# Patient Record
Sex: Male | Born: 1965 | Race: White | Hispanic: No | Marital: Married | Smoking: Never smoker
Health system: Southern US, Community
[De-identification: ages and names within clinical notes are randomized; demographics above are authoritative.]

## PROBLEM LIST (undated history)

## (undated) DIAGNOSIS — L280 Lichen simplex chronicus: Secondary | ICD-10-CM

## (undated) DIAGNOSIS — T7840XA Allergy, unspecified, initial encounter: Secondary | ICD-10-CM

## (undated) HISTORY — DX: Allergy, unspecified, initial encounter: T78.40XA

## (undated) HISTORY — PX: TONSILLECTOMY: SUR1361

## (undated) HISTORY — DX: Lichen simplex chronicus: L28.0

---

## 2011-05-23 ENCOUNTER — Encounter: Payer: Self-pay | Admitting: *Deleted

## 2011-05-23 ENCOUNTER — Emergency Department (INDEPENDENT_AMBULATORY_CARE_PROVIDER_SITE_OTHER): Payer: Worker's Compensation

## 2011-05-23 ENCOUNTER — Emergency Department (HOSPITAL_BASED_OUTPATIENT_CLINIC_OR_DEPARTMENT_OTHER)
Admission: EM | Admit: 2011-05-23 | Discharge: 2011-05-24 | Disposition: A | Discharge status: Home / Self Care | Payer: Worker's Compensation | Attending: Emergency Medicine | Admitting: Emergency Medicine

## 2011-05-23 DIAGNOSIS — S139XXA Sprain of joints and ligaments of unspecified parts of neck, initial encounter: Secondary | ICD-10-CM | POA: Insufficient documentation

## 2011-05-23 DIAGNOSIS — S161XXA Strain of muscle, fascia and tendon at neck level, initial encounter: Secondary | ICD-10-CM

## 2011-05-23 DIAGNOSIS — Y92410 Unspecified street and highway as the place of occurrence of the external cause: Secondary | ICD-10-CM | POA: Insufficient documentation
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
## 2011-05-23 NOTE — ED Notes (Signed)
Dr. MIller at bedside

## 2011-05-24 DIAGNOSIS — M542 Cervicalgia: Secondary | ICD-10-CM

## 2011-05-24 MED ORDER — METHOCARBAMOL 500 MG PO TABS: 500.0000 mg | ORAL_TABLET | Freq: Two times a day (BID) | ORAL | Status: AC | PRN

## 2011-05-24 MED ORDER — NAPROXEN 500 MG PO TABS: 500.0000 mg | ORAL_TABLET | Freq: Two times a day (BID) | ORAL | Status: AC

## 2012-09-07 ENCOUNTER — Encounter: Payer: Self-pay | Admitting: Family Medicine

## 2012-09-14 ENCOUNTER — Ambulatory Visit (INDEPENDENT_AMBULATORY_CARE_PROVIDER_SITE_OTHER): Payer: 59 | Admitting: Family Medicine

## 2012-09-14 VITALS — BP 100/62 | HR 47 | Temp 97.7°F | Resp 16 | Ht 71.0 in | Wt 176.0 lb

## 2012-09-14 DIAGNOSIS — Z79899 Other long term (current) drug therapy: Secondary | ICD-10-CM

## 2012-09-14 DIAGNOSIS — B351 Tinea unguium: Secondary | ICD-10-CM

## 2012-09-14 DIAGNOSIS — M792 Neuralgia and neuritis, unspecified: Secondary | ICD-10-CM

## 2012-09-14 DIAGNOSIS — Z23 Encounter for immunization: Secondary | ICD-10-CM

## 2012-09-14 DIAGNOSIS — Z0000 Encounter for general adult medical examination without abnormal findings: Secondary | ICD-10-CM

## 2012-09-14 DIAGNOSIS — S76319A Strain of muscle, fascia and tendon of the posterior muscle group at thigh level, unspecified thigh, initial encounter: Secondary | ICD-10-CM

## 2012-09-14 DIAGNOSIS — L989 Disorder of the skin and subcutaneous tissue, unspecified: Secondary | ICD-10-CM

## 2012-09-14 LAB — TSH: TSH: 2.377 u[IU]/mL (ref 0.350–4.500)

## 2012-09-14 LAB — CBC WITH DIFFERENTIAL/PLATELET
Basophils Relative: 1 % (ref 0–1)
Eosinophils Absolute: 0.3 10*3/uL (ref 0.0–0.7)
Eosinophils Relative: 9 % — ABNORMAL HIGH (ref 0–5)
Hemoglobin: 14.3 g/dL (ref 13.0–17.0)
Lymphs Abs: 1.1 10*3/uL (ref 0.7–4.0)
MCH: 30 pg (ref 26.0–34.0)
MCHC: 34.9 g/dL (ref 30.0–36.0)
MCV: 86 fL (ref 78.0–100.0)
Monocytes Relative: 12 % (ref 3–12)
Neutrophils Relative %: 45 % (ref 43–77)
RBC: 4.77 MIL/uL (ref 4.22–5.81)

## 2012-09-14 LAB — COMPREHENSIVE METABOLIC PANEL
Albumin: 4.2 g/dL (ref 3.5–5.2)
Alkaline Phosphatase: 61 U/L (ref 39–117)
BUN: 26 mg/dL — ABNORMAL HIGH (ref 6–23)
CO2: 23 mEq/L (ref 19–32)
Calcium: 9.4 mg/dL (ref 8.4–10.5)
Creat: 1.01 mg/dL (ref 0.50–1.35)
Glucose, Bld: 80 mg/dL (ref 70–99)
Sodium: 142 mEq/L (ref 135–145)
Total Bilirubin: 0.6 mg/dL (ref 0.3–1.2)
Total Protein: 6.6 g/dL (ref 6.0–8.3)

## 2012-09-14 LAB — LIPID PANEL
Cholesterol: 196 mg/dL (ref 0–200)
HDL: 72 mg/dL (ref >39)
LDL Cholesterol: 111 mg/dL — ABNORMAL HIGH (ref 0–99)
Triglycerides: 67 mg/dL (ref <150)
VLDL: 13 mg/dL (ref 0–40)

## 2012-09-14 MED ORDER — TERBINAFINE HCL 250 MG PO TABS: 250.0000 mg | ORAL_TABLET | Freq: Every day | ORAL | Status: DC

## 2012-09-14 NOTE — Progress Notes (Signed)
Subjective:    Patient ID: Gregory Ayala, male    DOB: Oct 16, 1965, 47 y.o.   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 He has not had one in many years and does have a few concerns. Has no idea when last Td immunization was. Has never had a colonoscopy or PSA test.  He does have a lesion on the side of his nose for several mos he would like looked at. Had a similar one on the other side but it went away, will scratch at it and it will bleed. Went running the other day and think he might have pulled something. His posterior Left leg is hurting a lot. When sitting in his vehicle on duty - with the large utility belt around his waist, after about 30 min to an hr his legs will fall asleep. He will get numbness and tingling from his buttocks radiating down and he has to stand up and move around.   Past Medical History  Diagnosis Date  . Allergy    Past Surgical History  Procedure Laterality Date  . Tonsillectomy     No current outpatient prescriptions on file prior to visit.   No current facility-administered medications on file prior to visit.   No Known Allergies Family History  Problem Relation Age of Onset  . Hypertension Father    History   Social History  . Marital Status: Married    Spouse Name: N/A    Number of Children: N/A  . Years of Education: N/A   Social History Main Topics  . Smoking status: Never Smoker   . Smokeless tobacco: Never Used  . Alcohol Use: 3.6 oz/week    6 Cans of beer per week  . Drug Use: No  . Sexually Active: None   Other Topics Concern  . None   Social History Narrative  . Work in Patent examiner  Does aerobic exercise 3x/wks for 30 min   Review of Systems  Constitutional: Negative.   HENT: Negative.   Eyes: Negative.   Respiratory: Negative.   Cardiovascular: Negative.   Gastrointestinal: Negative.   Endocrine: Negative.   Genitourinary: Negative.   Musculoskeletal: Negative.    Skin: Positive for wound.  Allergic/Immunologic: Negative.   Neurological: Negative.   Hematological: Negative.   Psychiatric/Behavioral: Negative.       BP 100/62  Pulse 47  Temp(Src) 97.7 F (36.5 C) (Oral)  Resp 16  Ht 5\' 11"  (1.803 m)  Wt 176 lb (79.833 kg)  BMI 24.56 kg/m2  SpO2 97% Objective:   Physical Exam  Constitutional: He is oriented to person, place, and time. He appears well-developed and well-nourished. No distress.  HENT:  Head: Normocephalic and atraumatic.  Right Ear: Tympanic membrane, external ear and ear canal normal.  Left Ear: Tympanic membrane, external ear and ear canal normal.  Nose: Nose normal.  Mouth/Throat: Uvula is midline, oropharynx is clear and moist and mucous membranes are normal. No oropharyngeal exudate.  Eyes: Conjunctivae are normal. Right eye exhibits no discharge. Left eye exhibits no discharge. No scleral icterus.  Neck: Normal range of motion. Neck supple. No thyromegaly present.  Cardiovascular: Normal rate, regular rhythm, normal heart sounds and intact distal pulses.   Pulmonary/Chest: Effort normal and breath sounds normal. No respiratory distress.  Abdominal: Soft. Bowel sounds are normal. He exhibits no distension and no mass. There is no tenderness. There is no rebound and no guarding.  Musculoskeletal:  He exhibits no edema.       Right hip: Normal.       Left hip: Normal.       Lumbar back: Normal.       Right upper leg: Normal.       Left upper leg: Normal.  Lymphadenopathy:    He has no cervical adenopathy.  Neurological: He is alert and oriented to person, place, and time. He has normal reflexes. No cranial nerve deficit. He exhibits normal muscle tone.  Reflex Scores:      Patellar reflexes are 2+ on the right side and 2+ on the left side.      Achilles reflexes are 2+ on the right side and 2+ on the left side. Skin: Skin is warm and dry. Lesion noted. No rash noted. He is not diaphoretic. No erythema.  Toenails  thick, yellow, cracking Small pearly pink lesion on side of left nose with central ulceration and few telangiectasias.  Psychiatric: He has a normal mood and affect. His behavior is normal.      Assessment & Plan:  Neuralgia - Plan: TSH, Vitamin D 25 hydroxy, Lipid panel, PSA, CBC with Differential, Comprehensive metabolic panel - likely just from increased pressure onto sciatic nerve from heavy belt - try rocking forward onto ischial tuberosities.  Hamstring strain - Plan: TSH, Vitamin D 25 hydroxy, Lipid panel, PSA, CBC with Differential, Comprehensive metabolic panel. RICE  Lesion of ala of nose - Plan: Ambulatory referral to Dermatology, TSH, Vitamin D 25 hydroxy, Lipid panel, PSA, CBC with Differential, Comprehensive metabolic panel - refer to derm as concern for basal cell. Reluctant to do biopsy in office since on face.  Onychomycosis - Plan: terbinafine (LAMISIL) 250 MG tablet, TSH, Vitamin D 25 hydroxy, Lipid panel, PSA, CBC with Differential, Comprehensive metabolic panel - starting lamisil so RTC in 6-8 wks for LFT recheck - future orders entered so can be lab visit only  Routine general medical examination at a health care facility - Plan: TSH, Vitamin D 25 hydroxy, Lipid panel, PSA, CBC with Differential, Comprehensive metabolic panel  Need for prophylactic vaccination with combined diphtheria-tetanus-pertussis (DTP) vaccine - Plan: Tdap vaccine greater than or equal to 7yo IM  Encounter for long-term (current) use of other medications - Plan: Hepatic Function Panel  Meds ordered this encounter  Medications  . terbinafine (LAMISIL) 250 MG tablet    Sig: Take 1 tablet (250 mg total) by mouth daily.    Dispense:  90 tablet    Refill:  1

## 2012-09-16 ENCOUNTER — Telehealth: Payer: Self-pay

## 2012-09-16 NOTE — Telephone Encounter (Signed)
Pt is calling back about lab results, he missed the first call

## 2012-09-17 NOTE — Telephone Encounter (Signed)
Labs perfect. Drink more water. Per Dr Clelia Croft. Left message for him, he missed this call too.

## 2012-09-17 NOTE — Telephone Encounter (Signed)
Pt.notified

## 2012-10-17 DIAGNOSIS — L280 Lichen simplex chronicus: Secondary | ICD-10-CM

## 2012-10-17 HISTORY — DX: Lichen simplex chronicus: L28.0

## 2012-10-26 ENCOUNTER — Other Ambulatory Visit (INDEPENDENT_AMBULATORY_CARE_PROVIDER_SITE_OTHER): Payer: 59 | Admitting: Family Medicine

## 2012-10-26 DIAGNOSIS — Z79899 Other long term (current) drug therapy: Secondary | ICD-10-CM

## 2012-10-26 LAB — HEPATIC FUNCTION PANEL
Albumin: 4.4 g/dL (ref 3.5–5.2)
Total Protein: 6.7 g/dL (ref 6.0–8.3)

## 2012-10-26 NOTE — Progress Notes (Signed)
Patient here for labs only. 

## 2012-11-06 ENCOUNTER — Encounter: Payer: Self-pay | Admitting: Family Medicine

## 2013-04-23 ENCOUNTER — Other Ambulatory Visit: Payer: Self-pay | Admitting: Family Medicine

## 2013-04-26 ENCOUNTER — Other Ambulatory Visit: Payer: Self-pay | Admitting: Family Medicine

## 2013-04-26 NOTE — Telephone Encounter (Signed)
Dr Clelia Croft, do you want to continue pt on this, or does he need to RTC for re-eval or another LFT lab?

## 2013-04-26 NOTE — Telephone Encounter (Signed)
My concern is that if it hasn't worked after being on it for 6 months - that it might not work at all.  If he is still having toenail problems, rec RTC so we can clip his nail and send it for fungal culture to see if this is actually what is going on or if it could be something else (another dermatologic problem) mimicking nail fungus.  If it is almost better and almost gone - ok to refill once more only for one 90d supply.

## 2013-05-01 NOTE — Telephone Encounter (Signed)
LMOM on cell # to CB. 

## 2013-05-01 NOTE — Telephone Encounter (Signed)
Pt CB and reported that the fungus has almost completely resolved, just a small place left. Pt agreed to RTC if persists or worsens again.

## 2013-08-05 IMAGING — CR DG CHEST 1V
1 series · 1 of 1 positions shown · non-contrast
Comparison: None.

CLINICAL DATA: No history given.

CHEST - 1 VIEW

[PA]
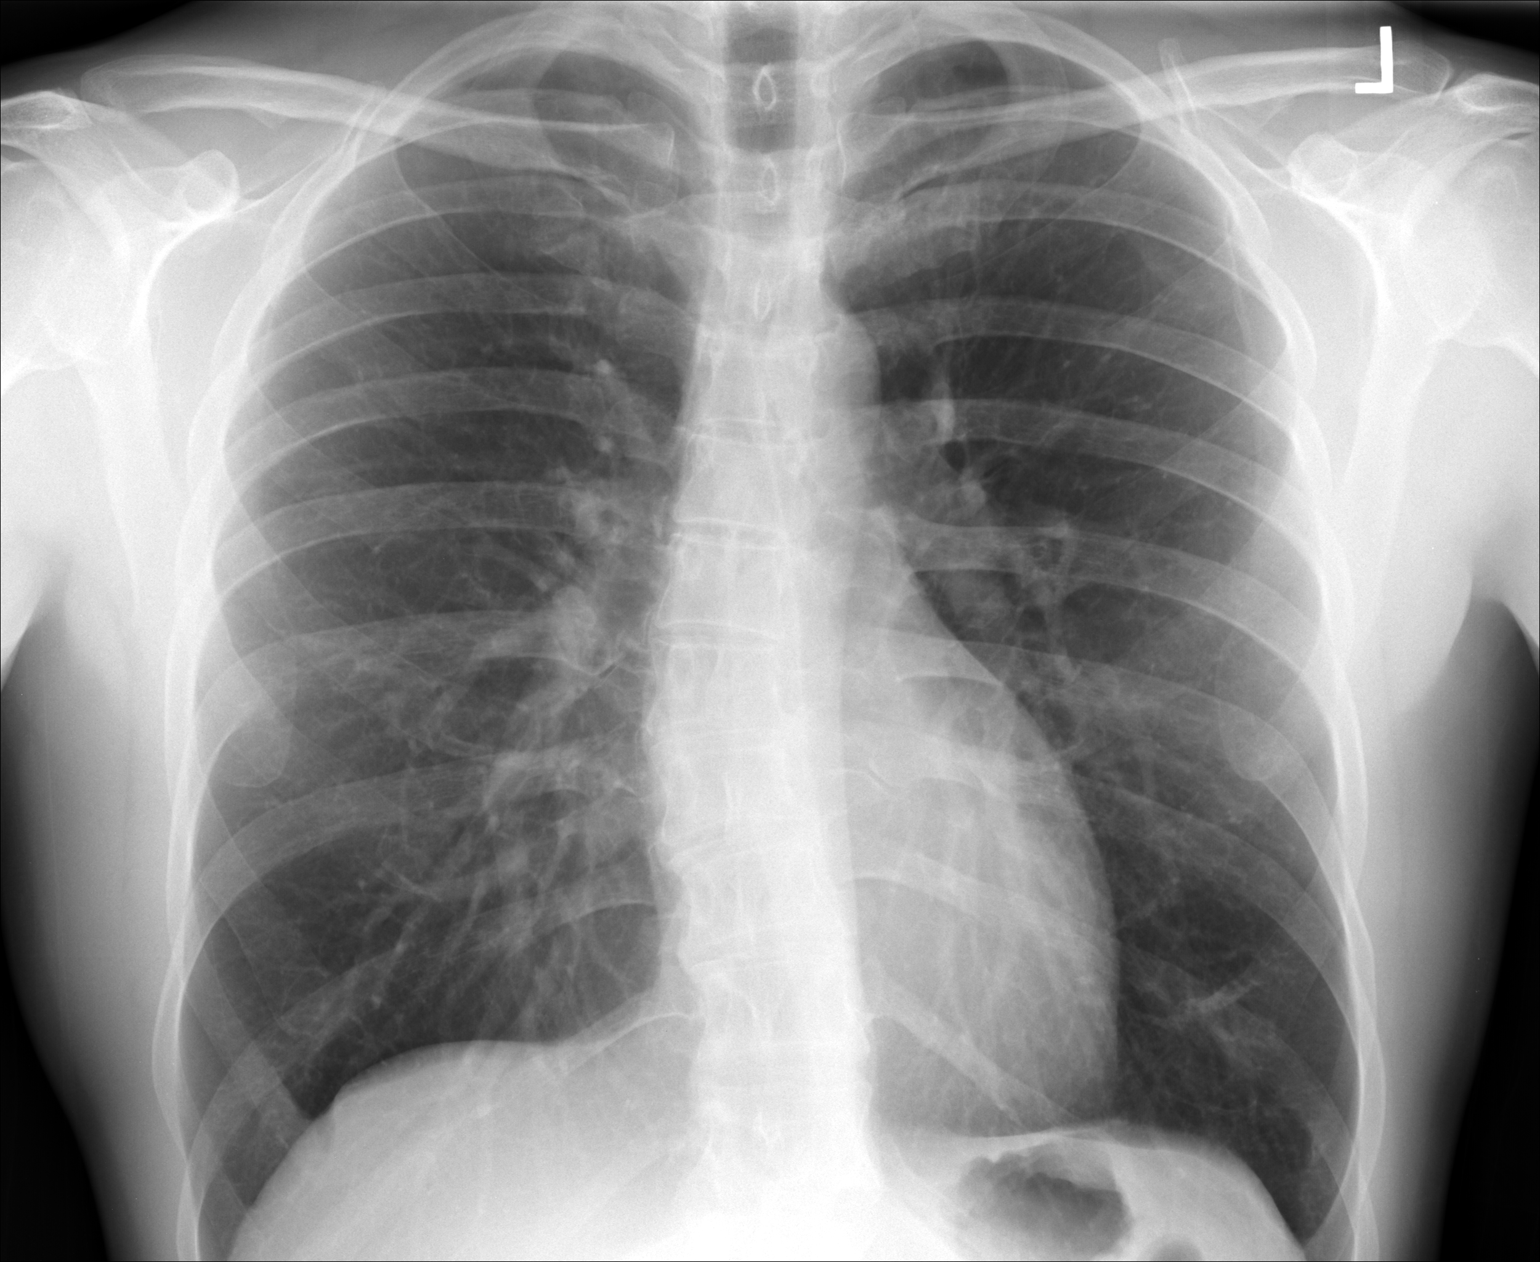

[1 of 1 positions shown; findings below may reference images not displayed]

FINDINGS: The cardiac silhouette is normal size and shape.
Diaphragm is low-lying which may reflect body habitus or
hyperinflation.  No pulmonary infiltrates or nodules.  No
mediastinal or hilar enlargement. No pleural abnormality is
evident.  Slight scoliosis convexity to the right.
IMPRESSION: Low-lying diaphragm may reflect body habitus versus hyperinflation.
No pulmonary infiltrates.  Scoliosis.

## 2014-06-18 ENCOUNTER — Ambulatory Visit (INDEPENDENT_AMBULATORY_CARE_PROVIDER_SITE_OTHER): Payer: 59 | Admitting: Family Medicine

## 2014-06-18 VITALS — BP 117/73 | HR 60 | Temp 98.0°F | Resp 16 | Ht 71.0 in | Wt 179.8 lb

## 2014-06-18 DIAGNOSIS — R05 Cough: Secondary | ICD-10-CM

## 2014-06-18 DIAGNOSIS — H9202 Otalgia, left ear: Secondary | ICD-10-CM

## 2014-06-18 DIAGNOSIS — J029 Acute pharyngitis, unspecified: Secondary | ICD-10-CM

## 2014-06-18 DIAGNOSIS — R059 Cough, unspecified: Secondary | ICD-10-CM

## 2014-06-18 LAB — POCT RAPID STREP A (OFFICE): RAPID STREP A SCREEN: NEGATIVE

## 2014-06-18 MED ORDER — AMOXICILLIN 875 MG PO TABS: 875.0000 mg | ORAL_TABLET | Freq: Two times a day (BID) | ORAL | Status: DC

## 2014-06-18 MED ORDER — HYDROCODONE-HOMATROPINE 5-1.5 MG/5ML PO SYRP: 5.0000 mL | ORAL_SOLUTION | ORAL | Status: DC | PRN

## 2014-06-18 NOTE — Patient Instructions (Signed)
Drink plenty of fluids and get enough rest  Return if worse  Amoxicillin twice daily  Cough syrup 1 teaspoon every 4 hours as needed.  Can cause sedation.

## 2014-06-18 NOTE — Progress Notes (Signed)
Subjective: Sore throat, then cough.  Started 5 days ago.  Mild productivity .  Trouble resting.Otalgia.  Objective: TM's nl, throat mild erythema. TM's nl.  Neck supple, no major nodes.  Chest CTA  Assessment Pharyngitis, cough  Plan RST  Results for orders placed or performed in visit on 06/18/14  POCT rapid strep A  Result Value Ref Range   Rapid Strep A Screen Negative Negative   RX symptomatically and add amoxicillin bid RTC if worse

## 2015-07-31 LAB — IFOBT (OCCULT BLOOD): IMMUNOLOGICAL FECAL OCCULT BLOOD TEST: NEGATIVE

## 2015-09-09 ENCOUNTER — Encounter: Payer: Self-pay | Admitting: Family Medicine

## 2015-11-16 ENCOUNTER — Ambulatory Visit (INDEPENDENT_AMBULATORY_CARE_PROVIDER_SITE_OTHER): Payer: 59 | Admitting: Family Medicine

## 2015-11-16 VITALS — BP 108/70 | HR 50 | Temp 97.9°F | Resp 15 | Ht 71.0 in | Wt 173.4 lb

## 2015-11-16 DIAGNOSIS — Z0000 Encounter for general adult medical examination without abnormal findings: Secondary | ICD-10-CM | POA: Diagnosis not present

## 2015-11-16 LAB — CBC
HCT: 43.6 % (ref 38.5–50.0)
HEMOGLOBIN: 15 g/dL (ref 13.2–17.1)
MCH: 31.1 pg (ref 27.0–33.0)
MCHC: 34.4 g/dL (ref 32.0–36.0)
MCV: 90.5 fL (ref 80.0–100.0)
MPV: 9.5 fL (ref 7.5–12.5)
PLATELETS: 245 10*3/uL (ref 140–400)
RBC: 4.82 MIL/uL (ref 4.20–5.80)
RDW: 13.2 % (ref 11.0–15.0)
WBC: 3.5 10*3/uL — ABNORMAL LOW (ref 3.8–10.8)

## 2015-11-16 LAB — TSH: TSH: 2 m[IU]/L (ref 0.40–4.50)

## 2015-11-16 LAB — IFOBT (OCCULT BLOOD): IFOBT: NEGATIVE
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
## 2015-11-16 NOTE — Patient Instructions (Addendum)
Zyrtec is an excellent antihistamine. However if it ceases to work well you can try loratadine (Claritin), fexofenadine (Allegra), or Xyzal  Try using some 1% hydrocortisone cream which she can buy over-the-counter on the arms when you get the summertime itching and rash. You can use a little of that also on the tick bites.  If you develop "flulike" symptoms get checked promptly because they could be related to the tick bite, which usually occur within 1-3 weeks of getting bitten.  Return annually or as needed.  Referral is being made for a colonoscopy.   IF you received an x-ray today, you will receive an invoice from [facility]. Please contact [facility] at [phone] with questions or concerns regarding your invoice.   IF you received labwork today, you will receive an invoice from [employer]. Please contact [facility] at [phone] with questions or concerns regarding your invoice.   Our billing staff will not be able to assist you with questions regarding bills from these companies.  You will be contacted with the lab results as soon as they are available. The fastest way to get your results is to activate your My Chart account. Instructions are located on the last page of this paperwork. If you have not heard from [place] regarding the results in 2 weeks, please contact this office.

## 2015-11-16 NOTE — Addendum Note (Signed)
Addended by: Frutoso Chase A on: 11/16/2015 04:46 PM   Modules accepted: Orders, SmartSet

## 2015-11-17 ENCOUNTER — Encounter: Payer: Self-pay | Admitting: *Deleted

## 2015-11-17 LAB — COMPREHENSIVE METABOLIC PANEL
ALBUMIN: 4.5 g/dL (ref 3.6–5.1)
ALK PHOS: 58 U/L (ref 40–115)
ALT: 22 U/L (ref 9–46)
AST: 27 U/L (ref 10–35)
BUN: 14 mg/dL (ref 7–25)
CO2: 26 mmol/L (ref 20–31)
CREATININE: 0.91 mg/dL (ref 0.70–1.33)
Calcium: 9.3 mg/dL (ref 8.6–10.3)
Chloride: 102 mmol/L (ref 98–110)
Glucose, Bld: 79 mg/dL (ref 65–99)
POTASSIUM: 4.2 mmol/L (ref 3.5–5.3)
Sodium: 139 mmol/L (ref 135–146)
TOTAL PROTEIN: 6.8 g/dL (ref 6.1–8.1)
Total Bilirubin: 0.7 mg/dL (ref 0.2–1.2)

## 2015-11-17 LAB — LIPID PANEL
CHOL/HDL RATIO: 2.1 ratio (ref <=5.0)
CHOLESTEROL: 194 mg/dL (ref 125–200)
HDL: 94 mg/dL (ref >=40)
LDL Cholesterol: 86 mg/dL (ref <130)
TRIGLYCERIDES: 71 mg/dL (ref <150)
VLDL: 14 mg/dL (ref <30)

## 2015-11-17 LAB — PSA: PSA: 0.43 ng/mL (ref <=4.00)

## 2016-08-20 DIAGNOSIS — R05 Cough: Secondary | ICD-10-CM | POA: Diagnosis not present

## 2016-08-20 DIAGNOSIS — J0140 Acute pansinusitis, unspecified: Secondary | ICD-10-CM | POA: Diagnosis not present

## 2016-09-19 DIAGNOSIS — M722 Plantar fascial fibromatosis: Secondary | ICD-10-CM | POA: Diagnosis not present

## 2016-09-19 DIAGNOSIS — M9903 Segmental and somatic dysfunction of lumbar region: Secondary | ICD-10-CM | POA: Diagnosis not present

## 2016-09-19 DIAGNOSIS — M5441 Lumbago with sciatica, right side: Secondary | ICD-10-CM | POA: Diagnosis not present

## 2016-09-22 DIAGNOSIS — M722 Plantar fascial fibromatosis: Secondary | ICD-10-CM | POA: Diagnosis not present

## 2016-09-22 DIAGNOSIS — M5441 Lumbago with sciatica, right side: Secondary | ICD-10-CM | POA: Diagnosis not present

## 2016-09-22 DIAGNOSIS — M9903 Segmental and somatic dysfunction of lumbar region: Secondary | ICD-10-CM | POA: Diagnosis not present

## 2016-09-28 DIAGNOSIS — M722 Plantar fascial fibromatosis: Secondary | ICD-10-CM | POA: Diagnosis not present

## 2016-09-28 DIAGNOSIS — M5441 Lumbago with sciatica, right side: Secondary | ICD-10-CM | POA: Diagnosis not present

## 2016-09-28 DIAGNOSIS — M9903 Segmental and somatic dysfunction of lumbar region: Secondary | ICD-10-CM | POA: Diagnosis not present

## 2016-09-30 DIAGNOSIS — M9903 Segmental and somatic dysfunction of lumbar region: Secondary | ICD-10-CM | POA: Diagnosis not present

## 2016-09-30 DIAGNOSIS — M5441 Lumbago with sciatica, right side: Secondary | ICD-10-CM | POA: Diagnosis not present

## 2016-09-30 DIAGNOSIS — M722 Plantar fascial fibromatosis: Secondary | ICD-10-CM | POA: Diagnosis not present

## 2016-10-04 DIAGNOSIS — L570 Actinic keratosis: Secondary | ICD-10-CM | POA: Diagnosis not present

## 2016-10-04 DIAGNOSIS — D229 Melanocytic nevi, unspecified: Secondary | ICD-10-CM | POA: Diagnosis not present

## 2016-10-06 DIAGNOSIS — M722 Plantar fascial fibromatosis: Secondary | ICD-10-CM | POA: Diagnosis not present

## 2016-10-06 DIAGNOSIS — M5441 Lumbago with sciatica, right side: Secondary | ICD-10-CM | POA: Diagnosis not present

## 2016-10-06 DIAGNOSIS — M9903 Segmental and somatic dysfunction of lumbar region: Secondary | ICD-10-CM | POA: Diagnosis not present

## 2016-10-17 DIAGNOSIS — M9903 Segmental and somatic dysfunction of lumbar region: Secondary | ICD-10-CM | POA: Diagnosis not present

## 2016-10-17 DIAGNOSIS — M722 Plantar fascial fibromatosis: Secondary | ICD-10-CM | POA: Diagnosis not present

## 2016-10-17 DIAGNOSIS — M5441 Lumbago with sciatica, right side: Secondary | ICD-10-CM | POA: Diagnosis not present

## 2016-11-10 DIAGNOSIS — M9903 Segmental and somatic dysfunction of lumbar region: Secondary | ICD-10-CM | POA: Diagnosis not present

## 2016-11-10 DIAGNOSIS — M722 Plantar fascial fibromatosis: Secondary | ICD-10-CM | POA: Diagnosis not present

## 2016-11-10 DIAGNOSIS — M5441 Lumbago with sciatica, right side: Secondary | ICD-10-CM | POA: Diagnosis not present

## 2016-12-22 DIAGNOSIS — M722 Plantar fascial fibromatosis: Secondary | ICD-10-CM | POA: Diagnosis not present

## 2016-12-22 DIAGNOSIS — M5441 Lumbago with sciatica, right side: Secondary | ICD-10-CM | POA: Diagnosis not present

## 2016-12-22 DIAGNOSIS — M9903 Segmental and somatic dysfunction of lumbar region: Secondary | ICD-10-CM | POA: Diagnosis not present

## 2017-01-19 DIAGNOSIS — M5441 Lumbago with sciatica, right side: Secondary | ICD-10-CM | POA: Diagnosis not present

## 2017-01-19 DIAGNOSIS — M722 Plantar fascial fibromatosis: Secondary | ICD-10-CM | POA: Diagnosis not present

## 2017-01-19 DIAGNOSIS — M9903 Segmental and somatic dysfunction of lumbar region: Secondary | ICD-10-CM | POA: Diagnosis not present

## 2017-05-01 DIAGNOSIS — H10413 Chronic giant papillary conjunctivitis, bilateral: Secondary | ICD-10-CM | POA: Diagnosis not present

## 2017-12-08 DIAGNOSIS — Z125 Encounter for screening for malignant neoplasm of prostate: Secondary | ICD-10-CM | POA: Diagnosis not present

## 2017-12-08 DIAGNOSIS — Z77011 Contact with and (suspected) exposure to lead: Secondary | ICD-10-CM | POA: Diagnosis not present

## 2017-12-08 DIAGNOSIS — Z0000 Encounter for general adult medical examination without abnormal findings: Secondary | ICD-10-CM | POA: Diagnosis not present

## 2018-05-22 DIAGNOSIS — Z0100 Encounter for examination of eyes and vision without abnormal findings: Secondary | ICD-10-CM | POA: Diagnosis not present

## 2019-09-24 ENCOUNTER — Encounter: Payer: Self-pay | Admitting: Family Medicine

## 2019-09-24 ENCOUNTER — Other Ambulatory Visit: Payer: Self-pay

## 2019-09-24 ENCOUNTER — Ambulatory Visit (INDEPENDENT_AMBULATORY_CARE_PROVIDER_SITE_OTHER): Payer: 59 | Admitting: Family Medicine

## 2019-09-24 DIAGNOSIS — M25511 Pain in right shoulder: Secondary | ICD-10-CM | POA: Diagnosis not present

## 2019-09-24 DIAGNOSIS — G8929 Other chronic pain: Secondary | ICD-10-CM | POA: Diagnosis not present

## 2019-09-24 NOTE — Progress Notes (Signed)
I saw and examined the patient with Dr. Mayer Masker and agree with assessment and plan as outlined.    Chronic mild right shoulder pain, lateral subacromial space.  Pain consistent with impingement.  X-Rays viewed on computer show moderate AC arthropathy with slight hook on acromion, but not having tenderness there.  Will try home exercises.  Consider injection or PT if worsens.

## 2019-09-24 NOTE — Progress Notes (Signed)
Gregory Ayala - 54 y.o. male MRN [place]:[id number]  Date of birth: 1966-07-09  Office Visit Note: Visit Date: 09/24/2019 PCP: Patient, No Pcp Per Referred by: No ref. provider found  Subjective: Chief Complaint  Patient presents with  . Right Shoulder - Pain   HPI: Gregory Ayala is a 54 y.o. male who comes in today with chronic right shoulder pain for the past two years. Reports that he has pain in right shoulder with raising arm overhead. He gets occasional pain at night as well. He does not notice it when at rest. 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 He also has numbness in 2nd-4th digits on left hand most morning, improves when he shakes his hand out. He also gets the same numbness feeling in his right hand occasionally as well. Does not bother him much at the moment. No weakness. No pain with neck movement.    ROS Otherwise per HPI.  Assessment & Plan: Visit Diagnoses:  1. Chronic right shoulder pain     Plan: Right shoulder pain x 2 year with signs of rotator cuff impingement. Provided stretches and strengthening exercises with thera-band for rotator cuff home exercise program. If no improvement in 2-3 weeks, will refer to PT for more aggressive treatment. If worsening, will return for corticosteroid injection.     Follow-up: PRN  Procedures: No procedures performed  No notes on file   Clinical History: No specialty comments available.   He reports that he has never smoked. He has never used smokeless tobacco. No results for input(s): HGBA1C, LABURIC in the last 8760 hours.  Objective:  VS:  HT:    WT:   BMI:     BP:   HR: bpm  TEMP: ( )  RESP:  Physical Exam  PHYSICAL EXAM: Gen: NAD, alert, cooperative with exam, well-appearing HEENT: clear conjunctiva,  CV:  no edema, capillary refill brisk, normal rate Resp: non-labored Skin: no rashes, normal  turgor  Neuro: no gross deficits.  Psych:  alert and oriented  Ortho Exam  Right Shoulder: Inspection reveals no obvious deformity, atrophy, or asymmetry. No bruising. No swelling TTP over lateral acromion/lateral shoulder  Full ROM in flexion, abduction, internal/external rotation NV intact distally Normal scapular function observed. Special Tests:  - Impingement: positive Hawkins - Supraspinatous: Negative empty can.  5/5 strength with resisted flexion at 20 degrees - Infraspinatous/Teres Minor: 5/5 strength with ER - Subscapularis: negative belly press, negative bear hug. 5/5 strength with IR - Biceps tendon: Negative Speeds, Yerrgason's  - Labrum: Negative Obriens - AC Joint: Negative cross arm - Painful arc  Neck: Full ROM Negative spurlings  Imaging: No results found.  Past Medical/Family/Surgical/Social History: Medications & Allergies reviewed per EMR, new medications updated. There are no problems to display for this patient.  Past Medical History:  Diagnosis Date  . Allergy   . 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 Denna Haggard   Family History  Problem Relation Age of Onset  . Hypertension Father    Past Surgical History:  Procedure Laterality Date  . TONSILLECTOMY     Social History   Occupational History  . Not on file  Tobacco Use  . Smoking status: Never Smoker  . Smokeless tobacco: Never Used  Substance and Sexual Activity  . Alcohol use: Yes    Alcohol/week: 6.0 standard drinks  Types: 6 Cans of beer per week  . Drug use: No  . Sexual activity: Not on file

## 2019-09-24 NOTE — Progress Notes (Signed)
Pt states he has had pain for two years. Pt states pain is in right shoulder. No pain from neck. Pt states pain is increased when raising his arm up. Pt states pain wakes him up at night. Pt states he has no lack of ROM. Pt does state he has numbness in both hands.

## 2019-10-10 ENCOUNTER — Ambulatory Visit: Payer: 59 | Attending: Internal Medicine

## 2019-10-10 DIAGNOSIS — Z23 Encounter for immunization: Secondary | ICD-10-CM

## 2019-10-10 NOTE — Progress Notes (Signed)
   Covid-19 Vaccination Clinic  Name:  Gregory Ayala    MRN: [id number] DOB: [birth year]  10/10/2019  [name] was observed post Covid-19 immunization for 15 minutes without incident. He was provided with Vaccine Information Sheet and instruction to access the V-Safe system.   [name] was instructed to call 911 with any severe reactions post vaccine: [name] Kitchen Difficulty breathing  . Swelling of face and throat  . A fast heartbeat  . A bad rash all over body  . Dizziness and weakness   Immunizations Administered    Name Date Dose VIS Date Route   Pfizer COVID-19 Vaccine 10/10/2019  8:59 AM 0.3 mL 07/05/2019 Intramuscular   Manufacturer: [place]   Lot: [id number]   [place]: [id number]

## 2019-11-06 ENCOUNTER — Ambulatory Visit: Payer: 59 | Attending: Internal Medicine

## 2019-11-06 DIAGNOSIS — Z23 Encounter for immunization: Secondary | ICD-10-CM

## 2019-11-06 NOTE — Progress Notes (Signed)
   Covid-19 Vaccination Clinic  Name:  Gregory Ayala    MRN: [place]:[id number] DOB: 1966-04-11  11/06/2019  [name] was observed post Covid-19 immunization for 15 minutes without incident. He was provided with Vaccine Information Sheet and instruction to access the V-Safe system.   [name] was instructed to call 911 with any severe reactions post vaccine: [name] Kitchen Difficulty breathing  . Swelling of face and throat  . A fast heartbeat  . A bad rash all over body  . Dizziness and weakness   Immunizations Administered    Name Date Dose VIS Date Route   Pfizer COVID-19 Vaccine 11/06/2019  8:39 AM 0.3 mL 07/05/2019 Intramuscular   Manufacturer: [place]   Lot: [id number]   [place]: [id number]

## 2020-11-25 ENCOUNTER — Encounter: Payer: Self-pay | Admitting: Dermatology

## 2020-11-25 ENCOUNTER — Ambulatory Visit: Payer: 59 | Admitting: Dermatology

## 2020-11-25 ENCOUNTER — Other Ambulatory Visit: Payer: Self-pay

## 2020-11-25 DIAGNOSIS — L821 Other seborrheic keratosis: Secondary | ICD-10-CM | POA: Diagnosis not present

## 2020-11-25 DIAGNOSIS — Z1283 Encounter for screening for malignant neoplasm of skin: Secondary | ICD-10-CM | POA: Diagnosis not present

## 2020-12-10 ENCOUNTER — Encounter: Payer: Self-pay | Admitting: Dermatology

## 2020-12-10 NOTE — Progress Notes (Signed)
   New Patient   Subjective  Gregory Ayala is a 55 y.o. male who presents for the following: Annual Exam (Left shoulder x months- "I think I hit it").  General skin examination, newer spot on left shoulder became inflamed (perhaps after minor trauma and now possibly resolving). Location:  Duration:  Quality:  Associated Signs/Symptoms: Modifying Factors:  Severity:  Timing: Context:    The following portions of the chart were reviewed this encounter and updated as appropriate:  Tobacco  Allergies  Meds  Problems  Med Hx  Surg Hx  Fam Hx      Objective  Well appearing patient in no apparent distress; mood and affect are within normal limits. Objective  Mid Back: Waist up skin examination.  No atypical pigmented lesions or nonmelanoma skin cancer.  Objective  Right Breast: The right front shoulder slightly inflamed keratotic 76mm papule with dermoscopy showing no sign of melanocytic neoplasm.    All skin waist up examined.   Assessment & Plan  Screening for malignant neoplasm of skin Mid Back  Annual skin examination.  Encouraged to self examine with spouse twice annually.  Continued ultraviolet protection.  Seborrheic keratosis Right Breast  Offered to take confirmatory biopsy but patient comfortable with leaving spot unless there is clinical change.

## 2022-11-10 ENCOUNTER — Other Ambulatory Visit: Payer: Self-pay | Admitting: Internal Medicine

## 2022-11-10 DIAGNOSIS — E785 Hyperlipidemia, unspecified: Secondary | ICD-10-CM

## 2023-01-31 ENCOUNTER — Ambulatory Visit
Admission: RE | Admit: 2023-01-31 | Discharge: 2023-01-31 | Disposition: A | Discharge status: Home / Self Care | Payer: No Typology Code available for payment source | Source: Ambulatory Visit | Attending: Internal Medicine | Admitting: Internal Medicine

## 2023-01-31 DIAGNOSIS — E785 Hyperlipidemia, unspecified: Secondary | ICD-10-CM
